# Patient Record
Sex: Female | Born: 1965 | Hispanic: No | Marital: Married | Smoking: Never smoker
Health system: Southern US, Community
[De-identification: ages and names within clinical notes are randomized; demographics above are authoritative.]

## PROBLEM LIST (undated history)

## (undated) DIAGNOSIS — N841 Polyp of cervix uteri: Secondary | ICD-10-CM

## (undated) DIAGNOSIS — D649 Anemia, unspecified: Secondary | ICD-10-CM

## (undated) HISTORY — PX: OTHER SURGICAL HISTORY: SHX169

## (undated) HISTORY — PX: TUBAL LIGATION: SHX77

---

## 1989-03-16 HISTORY — PX: BREAST SURGERY: SHX581

## 2009-03-16 HISTORY — PX: BACK SURGERY: SHX140

## 2011-06-04 ENCOUNTER — Encounter: Payer: Self-pay | Admitting: Obstetrics and Gynecology

## 2011-06-19 ENCOUNTER — Other Ambulatory Visit: Payer: Self-pay | Admitting: Obstetrics and Gynecology

## 2011-07-06 ENCOUNTER — Encounter (HOSPITAL_COMMUNITY): Payer: Self-pay | Admitting: *Deleted

## 2011-07-17 ENCOUNTER — Encounter (HOSPITAL_COMMUNITY): Payer: Self-pay

## 2011-07-30 ENCOUNTER — Encounter (HOSPITAL_COMMUNITY): Payer: Self-pay | Admitting: Anesthesiology

## 2011-07-30 ENCOUNTER — Ambulatory Visit (HOSPITAL_COMMUNITY)
Admission: RE | Admit: 2011-07-30 | Discharge: 2011-07-30 | Disposition: A | Discharge status: Home / Self Care | Payer: BC Managed Care – PPO | Source: Ambulatory Visit | Attending: Obstetrics and Gynecology | Admitting: Obstetrics and Gynecology

## 2011-07-30 ENCOUNTER — Ambulatory Visit (HOSPITAL_COMMUNITY): Payer: BC Managed Care – PPO | Admitting: Anesthesiology

## 2011-07-30 ENCOUNTER — Encounter (HOSPITAL_COMMUNITY): Payer: Self-pay | Admitting: *Deleted

## 2011-07-30 ENCOUNTER — Encounter (HOSPITAL_COMMUNITY)
Admission: RE | Disposition: A | Discharge status: Home / Self Care | Payer: Self-pay | Source: Ambulatory Visit | Attending: Obstetrics and Gynecology

## 2011-07-30 DIAGNOSIS — N949 Unspecified condition associated with female genital organs and menstrual cycle: Secondary | ICD-10-CM | POA: Insufficient documentation

## 2011-07-30 DIAGNOSIS — N938 Other specified abnormal uterine and vaginal bleeding: Secondary | ICD-10-CM | POA: Insufficient documentation

## 2011-07-30 HISTORY — DX: Polyp of cervix uteri: N84.1

## 2011-07-30 HISTORY — DX: Anemia, unspecified: D64.9

## 2011-07-30 LAB — DIFFERENTIAL
Eosinophils Absolute: 0.2 10*3/uL (ref 0.0–0.7)
Eosinophils Relative: 4 % (ref 0–5)
Lymphs Abs: 1.8 10*3/uL (ref 0.7–4.0)
Monocytes Absolute: 0.4 10*3/uL (ref 0.1–1.0)
Monocytes Relative: 9 % (ref 3–12)

## 2011-07-30 LAB — URINALYSIS, ROUTINE W REFLEX MICROSCOPIC
Bilirubin Urine: NEGATIVE
Nitrite: NEGATIVE
Protein, ur: NEGATIVE mg/dL
Specific Gravity, Urine: 1.02 (ref 1.005–1.030)
Urobilinogen, UA: 0.2 mg/dL (ref 0.0–1.0)

## 2011-07-30 LAB — URINE MICROSCOPIC-ADD ON

## 2011-07-30 LAB — COMPREHENSIVE METABOLIC PANEL
BUN: 7 mg/dL (ref 6–23)
Calcium: 9.1 mg/dL (ref 8.4–10.5)
Creatinine, Ser: 0.58 mg/dL (ref 0.50–1.10)
GFR calc Af Amer: >90 mL/min (ref >90)
GFR calc non Af Amer: >90 mL/min (ref >90)
Glucose, Bld: 96 mg/dL (ref 70–99)
Total Protein: 6.8 g/dL (ref 6.0–8.3)

## 2011-07-30 LAB — CBC
HCT: 39 % (ref 36.0–46.0)
Hemoglobin: 12.7 g/dL (ref 12.0–15.0)
MCH: 30.3 pg (ref 26.0–34.0)
MCV: 93.1 fL (ref 78.0–100.0)
Platelets: 231 10*3/uL (ref 150–400)
RBC: 4.19 MIL/uL (ref 3.87–5.11)

## 2011-07-30 LAB — PREGNANCY, URINE: Preg Test, Ur: NEGATIVE

## 2011-07-30 SURGERY — DILATATION & CURETTAGE/HYSTEROSCOPY WITH RESECTOCOPE
Anesthesia: Spinal | Site: Vagina | Wound class: Clean Contaminated

## 2011-07-30 MED ORDER — LIDOCAINE HCL (CARDIAC) 20 MG/ML IV SOLN
INTRAVENOUS | Status: AC
  Filled 2011-07-30: qty 5

## 2011-07-30 MED ORDER — FENTANYL CITRATE 0.05 MG/ML IJ SOLN
INTRAMUSCULAR | Status: AC
  Filled 2011-07-30: qty 2

## 2011-07-30 MED ORDER — LIDOCAINE IN DEXTROSE 5-7.5 % IV SOLN
INTRAVENOUS | Status: DC | PRN
  Administered 2011-07-30: 50 mg via INTRATHECAL

## 2011-07-30 MED ORDER — FENTANYL CITRATE 0.05 MG/ML IJ SOLN: 25.0000 ug | INTRAMUSCULAR | Status: DC | PRN

## 2011-07-30 MED ORDER — MEPERIDINE HCL 25 MG/ML IJ SOLN: 6.2500 mg | INTRAMUSCULAR | Status: DC | PRN

## 2011-07-30 MED ORDER — METOCLOPRAMIDE HCL 5 MG/ML IJ SOLN: 10.0000 mg | Freq: Once | INTRAMUSCULAR | Status: DC | PRN

## 2011-07-30 MED ORDER — KETOROLAC TROMETHAMINE 30 MG/ML IJ SOLN
INTRAMUSCULAR | Status: DC | PRN
  Administered 2011-07-30: 30 mg via INTRAVENOUS

## 2011-07-30 MED ORDER — LACTATED RINGERS IV SOLN
INTRAVENOUS | Status: DC
  Administered 2011-07-30: 08:00:00 via INTRAVENOUS
  Administered 2011-07-30: 125 mL/h via INTRAVENOUS

## 2011-07-30 MED ORDER — PROPOFOL 10 MG/ML IV EMUL
INTRAVENOUS | Status: AC
  Filled 2011-07-30: qty 20

## 2011-07-30 MED ORDER — MIDAZOLAM HCL 5 MG/5ML IJ SOLN
INTRAMUSCULAR | Status: DC | PRN
  Administered 2011-07-30: 2 mg via INTRAVENOUS

## 2011-07-30 MED ORDER — DEXAMETHASONE SODIUM PHOSPHATE 10 MG/ML IJ SOLN
INTRAMUSCULAR | Status: AC
  Filled 2011-07-30: qty 1

## 2011-07-30 MED ORDER — KETOROLAC TROMETHAMINE 30 MG/ML IJ SOLN
INTRAMUSCULAR | Status: AC
  Filled 2011-07-30: qty 1

## 2011-07-30 MED ORDER — MIDAZOLAM HCL 2 MG/2ML IJ SOLN
INTRAMUSCULAR | Status: AC
  Filled 2011-07-30: qty 2

## 2011-07-30 MED ORDER — ONDANSETRON HCL 4 MG/2ML IJ SOLN
INTRAMUSCULAR | Status: AC
  Filled 2011-07-30: qty 2

## 2011-07-30 MED ORDER — GLYCINE 1.5 % IR SOLN
Status: DC | PRN
  Administered 2011-07-30: 3000 mL

## 2011-07-30 SURGICAL SUPPLY — 20 items
CANISTER SUCTION 2500CC (MISCELLANEOUS) ×2 IMPLANT
CATH ROBINSON RED A/P 16FR (CATHETERS) ×2 IMPLANT
CLOTH BEACON ORANGE TIMEOUT ST (SAFETY) ×2 IMPLANT
CONTAINER PREFILL 10% NBF 60ML (FORM) ×4 IMPLANT
CORD ACTIVE DISPOSABLE (ELECTRODE)
CORD ELECTRO ACTIVE DISP (ELECTRODE) IMPLANT
ELECT LOOP GYNE PRO 24FR (CUTTING LOOP)
ELECT REM PT RETURN 9FT ADLT (ELECTROSURGICAL) ×2
ELECT VAPORTRODE GRVD BAR (ELECTRODE) IMPLANT
ELECTRODE LOOP GYNE PRO 24FR (CUTTING LOOP) IMPLANT
ELECTRODE REM PT RTRN 9FT ADLT (ELECTROSURGICAL) ×1 IMPLANT
GLOVE BIO SURGEON STRL SZ7.5 (GLOVE) ×4 IMPLANT
GOWN PREVENTION PLUS LG XLONG (DISPOSABLE) ×2 IMPLANT
GOWN PREVENTION PLUS XLARGE (GOWN DISPOSABLE) ×2 IMPLANT
GOWN STRL REIN XL XLG (GOWN DISPOSABLE) ×2 IMPLANT
LIDOCAINE 1% PLAIN IMPLANT
LOOP ANGLED CUTTING 22FR (CUTTING LOOP) IMPLANT
PACK HYSTEROSCOPY LF (CUSTOM PROCEDURE TRAY) ×2 IMPLANT
TOWEL OR 17X24 6PK STRL BLUE (TOWEL DISPOSABLE) ×4 IMPLANT
WATER STERILE IRR 1000ML POUR (IV SOLUTION) ×2 IMPLANT

## 2011-07-30 NOTE — Anesthesia Preprocedure Evaluation (Addendum)
Anesthesia Evaluation  Patient identified by MRN, date of birth, ID band Patient awake    Reviewed: Allergy & Precautions, H&P , NPO status , Patient's Chart, lab work & pertinent test results  Airway Mallampati: I TM Distance: >3 FB Neck ROM: Full    Dental No notable dental hx. (+) Teeth Intact   Pulmonary neg pulmonary ROS,  breath sounds clear to auscultation  Pulmonary exam normal       Cardiovascular Exercise Tolerance: Good negative cardio ROS  Rhythm:Regular Rate:Normal     Neuro/Psych negative neurological ROS  negative psych ROS   GI/Hepatic negative GI ROS, Neg liver ROS,   Endo/Other  negative endocrine ROS  Renal/GU negative Renal ROS  negative genitourinary   Musculoskeletal negative musculoskeletal ROS (+)   Abdominal   Peds  Hematology negative hematology ROS (+)   Anesthesia Other Findings   Reproductive/Obstetrics negative OB ROS Endometrial Polyp                          Anesthesia Physical Anesthesia Plan  ASA: I  Anesthesia Plan: Spinal   Post-op Pain Management:    Induction:   Airway Management Planned:   Additional Equipment:   Intra-op Plan:   Post-operative Plan:   Informed Consent: I have reviewed the patients History and Physical, chart, labs and discussed the procedure including the risks, benefits and alternatives for the proposed anesthesia with the patient or authorized representative who has indicated his/her understanding and acceptance.   Dental advisory given  Plan Discussed with: CRNA, Anesthesiologist and Surgeon  Anesthesia Plan Comments: (Patient requests not to go to sleep - wants spinal anesthesia)       Anesthesia Quick Evaluation

## 2011-07-30 NOTE — Anesthesia Procedure Notes (Signed)
Spinal  Patient location during procedure: OR Start time: 07/30/2011 7:39 AM Staffing Performed by: anesthesiologist  Preanesthetic Checklist Completed: patient identified, site marked, surgical consent, pre-op evaluation, timeout performed, IV checked, risks and benefits discussed and monitors and equipment checked Spinal Block Patient position: sitting Prep: site prepped and draped and DuraPrep Patient monitoring: heart rate, cardiac monitor, continuous pulse ox and blood pressure Approach: midline Location: L3-4 Injection technique: single-shot Needle Needle type: Sprotte  Needle gauge: 24 G Needle length: 9 cm Assessment Sensory level: T4 Additional Notes Clear free flow CSF on first attempt.  No paresthesia.  Patient tolerated procedure well.  Jasmine December, MD

## 2011-07-30 NOTE — H&P (Signed)
46 yo Asian female for D&C FOR AUB.pT HAD POLYPS ON SONOHYSTEROGRAM  aLLERGIES nkda Illnesses None Operrations BTL   Alcohol, tobacco and drugs None   Ob hx 3 vaginal deliveries  PE vs NORMAL hent NEGATIVE nECK SUPPLE NO THYROMEGALY bREASTS NEGATIVE hEART AND LUNGS NORMAL aBDOMEN SOFT AND NOT TENDER pELVIC: uTERUS ANTERIOR AND NORMAL SIZE tHE ADNEXA ARE CLEAR   iMPRESSION: eNDOMETRIAL POLYPS aub

## 2011-07-30 NOTE — Anesthesia Postprocedure Evaluation (Signed)
Anesthesia Post Note  Patient: Sarah Jennings  Procedure(s) Performed: Procedure(s) (LRB): DILATATION & CURETTAGE/HYSTEROSCOPY WITH RESECTOCOPE (N/A)  Anesthesia type: Spinal  Patient location: PACU  Post pain: Pain level controlled  Post assessment: Post-op Vital signs reviewed  Last Vitals:  Filed Vitals:   07/30/11 1000  BP: 103/63  Pulse: 56  Temp:   Resp: 22    Post vital signs: Reviewed  Level of consciousness: awake  Complications: No apparent anesthesia complications

## 2011-07-30 NOTE — Progress Notes (Signed)
Patient ID: Sarah Jennings, female   DOB: 1965/05/31, 46 y.o.   MRN: [id number] Pt reports no change in her health since her pre op exam

## 2011-07-30 NOTE — Discharge Instructions (Signed)
No vaginal entrance x 2 days, call with temp > 100.4 degrees, heavy bleeding or any unusual problems.DISCHARGE INSTRUCTIONS: D&C / D&E The following instructions have been prepared to help you care for yourself upon your return home.   Personal hygiene: Marland Kitchen Use sanitary pads for vaginal drainage, not tampons. . Shower the day after your procedure. . NO tub baths, pools or Jacuzzis for 2-3 weeks. . Wipe front to back after using the bathroom.  Activity and limitations: . Do NOT drive or operate any equipment for 24 hours. The effects of anesthesia are still present and drowsiness may result. . Do NOT rest in bed all day. . Walking is encouraged. . Walk up and down stairs slowly. . You may resume your normal activity in one to two days or as indicated by your physician.  Sexual activity: NO intercourse for at least 2 weeks after the procedure, or as indicated by your physician.  Diet: Eat a light meal as desired this evening. You may resume your usual diet tomorrow.  Return to work: You may resume your work activities in one to two days or as indicated by your doctor.  What to expect after your surgery: Expect to have vaginal bleeding/discharge for 2-3 days and spotting for up to 10 days. It is not unusual to have soreness for up to 1-2 weeks. You may have a slight burning sensation when you urinate for the first day. Mild cramps may continue for a couple of days. You may have a regular period in 2-6 weeks.  Call your doctor for any of the following: . Excessive vaginal bleeding, saturating and changing one pad every hour. . Inability to urinate 6 hours after discharge from hospital. . Pain not relieved by pain medication. . Fever of 100.4 F or greater. . Unusual vaginal discharge or odor.  Return to office ________________ Call for an appointment ___________________  Patient's signature: ______________________  Nurse's signature ________________________  Post Anesthesia Care Unit  [phone]

## 2011-07-30 NOTE — Transfer of Care (Signed)
Immediate Anesthesia Transfer of Care Note  Patient: Sarah Jennings  Procedure(s) Performed: Procedure(s) (LRB): DILATATION & CURETTAGE/HYSTEROSCOPY WITH RESECTOCOPE (N/A)  Patient Location: PACU  Anesthesia Type: spinal  Level of Consciousness: sedated  Airway & Oxygen Therapy: Patient Spontanous Breathing and Patient connected to nasal cannula oxygen  Post-op Assessment: Report given to PACU RN and Post -op Vital signs reviewed and stable  Post vital signs: stable  Complications: No apparent anesthesia complications

## 2011-07-30 NOTE — H&P (Signed)
NAME:  Sarah Jennings, [place] NO.:  [id number]  MEDICAL RECORD NO.:  [id number]  LOCATION:                                 FACILITY:  PHYSICIAN:  Malachi Pro. Ambrose Mantle, M.D. DATE OF BIRTH:  April 04, 1965  DATE OF ADMISSION:  07/30/2011 DATE OF DISCHARGE:                             HISTORY & PHYSICAL   PRESENT ILLNESS:  This is a 46 year old Asian female para 3-0-0-3, who is admitted for D and C, hysteroscopy because of history of abnormal bleeding and endometrial polyps found on sonohysterogram.  Last period of June 29, 2011 normal in flow for 5 days.  I saw this patient on June 11, 2011.  At that time, she gave a history as follows.  She has had normal periods until the spring of 2012, when she missed her period in May 2012 and June 2012 and had a 10-day period in July 2012.  An ultrasound was done, with unknown results, but she was referred to an OB/GYN doctor.  An ultrasound was normal, but a sonohysterogram showed polyps and she was advised to have them removed.  Her periods are regular and last 4-5 days followed by 2-3 days of spotting.  The physician had advised her to have D and C and hysteroscopy and she wanted my opinion.  I reviewed the sonohysterogram, saw the 2 polyps that were documented and advised her that since she had a history of abnormal bleeding that she could have an endometrial biopsy to confirm benign endometrium and if it was benign then she could be followed for abnormal bleeding.  She could also have D and C and hysteroscopy since the sonohysterogram had documented polyps.  She chose to proceed with D and C, hysteroscopy.  ALLERGIES:  No known drug allergies.  PAST SURGICAL HISTORY:  Bilateral tubal ligation.  No significant medical history.  FAMILY HISTORY:  Mother has high blood pressure.  OB HISTORY:  Three vaginal deliveries without significant complications.  PHYSICAL EXAMINATION:  GENERAL:  Well developed, very slender Asian female, in  no distress. VITAL SIGNS:  At the time of her physical exam, she was 5 feet 2. Weighed 97 pounds.  Pulse was 62, blood pressure 98/64. HEAD, EYES, NOSE AND THROAT:  Normal. NECK:  Supple without thyromegaly. BREASTS:  Normal without masses, sitting or lying down. ABDOMEN:  Soft and nontender.  No masses were present.  Liver, spleen and kidneys were not felt. GU:  External genitalia appeared normal.  Vagina was clean.  The cervix was healthy.  No lesions were present.  Uterus was nontender.  Appeared to be midline in position, normal size.  Adnexal were free of masses. The patient stated that she had a normal Pap smear 2 months prior to my seeing her.  She had a mammogram 2 weeks prior that is showing a cyst. She denied smoking, denied illicit drugs and was a minimal drinker.  ADMITTING IMPRESSION:  History of abnormal uterine bleeding with endometrial polyp seen on sonohysterogram.  The patient chooses to proceed with D and C, hysteroscopy.  The risks of the procedure were discussed with her including infection, hemorrhage, deep venous thrombosis, injury to bladder, bowel, or ureter; perforation of the uterus,  possible blood transfusion, wound complications and fluid overload.  She understands and agrees to proceed.     Malachi Pro. Ambrose Mantle, M.D.     TFH/MEDQ  D:  07/29/2011  T:  07/29/2011  Job:  [id number]

## 2011-07-30 NOTE — Op Note (Signed)
Operative note on Sarah Jennings:  Date of the operation: 07/30/2011  Preoperative diagnosis: History of abnormal uterine bleeding, endometrial polyps by sonohysterogram  Postoperative diagnosis: No polyps found   Operation: D&C hysteroscopy  Operator: Ambrose Mantle  Anesthesia: Spinal Dr. Rodman Pickle  The patient was brought to the operating room and given a spinal anesthetic by Dr. Rodman Pickle. She was placed in the [place] stirrups in lithotomy position. The vulva vagina perineum and urethra were prepped with Betadine solution and the bladder was emptied with a [place] catheter. A timeout was done. Exam revealed the uterus to be anterior normal size the adnexa were free of masses. The area was draped as a sterile field. The cervix was grasped with a tenaculum and the uterus was sounded to 8 cm. The cervical canal was dilated and a hysteroscope was introduced. The entire endometrial cavity was visualized and there were no polyps. There was redundant endometrial tissue on the posterior uterine wall that could have masqueraded  as polyps on the sonohysterogram. A fractional D&C was done, producing a small amount of tissue from both the endocervix and the endometrial cavity. Polyp forceps were used to try to remove all the tissue from both sites. I looked again with the hysteroscope, introduced a grasper and removed small portions of endometrial tissue. The hysteroscope was removed, there was some bleeding from the tenaculum site and it was sutured with 2-0 chromic catgut. Hemostasis was adequate and the procedure was terminated. I was told it was a definite of 60 cc but the collection tubing had never been hooked up to the receptacle so the 60 cc was on the floor. Blood loss was less than 50 cc.. The patient was returned to recovery in satisfactory condition

## 2015-02-28 ENCOUNTER — Ambulatory Visit (INDEPENDENT_AMBULATORY_CARE_PROVIDER_SITE_OTHER): Payer: 59 | Admitting: Osteopathic Medicine

## 2015-02-28 ENCOUNTER — Encounter: Payer: Self-pay | Admitting: Osteopathic Medicine

## 2015-02-28 VITALS — BP 98/67 | HR 62 | Ht 63.0 in | Wt 95.0 lb

## 2015-02-28 DIAGNOSIS — Z23 Encounter for immunization: Secondary | ICD-10-CM | POA: Diagnosis not present

## 2015-02-28 DIAGNOSIS — Z1211 Encounter for screening for malignant neoplasm of colon: Secondary | ICD-10-CM | POA: Insufficient documentation

## 2015-02-28 DIAGNOSIS — Z862 Personal history of diseases of the blood and blood-forming organs and certain disorders involving the immune mechanism: Secondary | ICD-10-CM | POA: Insufficient documentation

## 2015-02-28 DIAGNOSIS — Z79899 Other long term (current) drug therapy: Secondary | ICD-10-CM | POA: Diagnosis not present

## 2015-02-28 DIAGNOSIS — Z139 Encounter for screening, unspecified: Secondary | ICD-10-CM | POA: Diagnosis not present

## 2015-02-28 DIAGNOSIS — Z0000 Encounter for general adult medical examination without abnormal findings: Secondary | ICD-10-CM | POA: Diagnosis not present

## 2015-02-28 DIAGNOSIS — E559 Vitamin D deficiency, unspecified: Secondary | ICD-10-CM

## 2015-02-28 LAB — COMPLETE METABOLIC PANEL WITH GFR
ALBUMIN: 4.7 g/dL (ref 3.6–5.1)
ALT: 24 U/L (ref 6–29)
AST: 28 U/L (ref 10–35)
Alkaline Phosphatase: 42 U/L (ref 33–130)
BILIRUBIN TOTAL: 0.8 mg/dL (ref 0.2–1.2)
BUN: 10 mg/dL (ref 7–25)
CALCIUM: 9.6 mg/dL (ref 8.6–10.4)
CHLORIDE: 102 mmol/L (ref 98–110)
CO2: 27 mmol/L (ref 20–31)
CREATININE: 0.48 mg/dL — AB (ref 0.50–1.05)
GFR, Est African American: >89 mL/min (ref >=60)
GFR, Est Non African American: >89 mL/min (ref >=60)
Glucose, Bld: 80 mg/dL (ref 65–99)
Potassium: 4.5 mmol/L (ref 3.5–5.3)
Sodium: 139 mmol/L (ref 135–146)
TOTAL PROTEIN: 7.4 g/dL (ref 6.1–8.1)

## 2015-02-28 LAB — LIPID PANEL
CHOL/HDL RATIO: 2 ratio (ref <=5.0)
Cholesterol: 184 mg/dL (ref 125–200)
HDL: 91 mg/dL (ref >=46)
LDL Cholesterol: 81 mg/dL (ref <130)
Triglycerides: 60 mg/dL (ref <150)
VLDL: 12 mg/dL (ref <30)

## 2015-02-28 LAB — CBC WITH DIFFERENTIAL/PLATELET
BASOS ABS: 0 10*3/uL (ref 0.0–0.1)
Basophils Relative: 1 % (ref 0–1)
Eosinophils Absolute: 0.1 10*3/uL (ref 0.0–0.7)
Eosinophils Relative: 2 % (ref 0–5)
HEMATOCRIT: 43.5 % (ref 36.0–46.0)
HEMOGLOBIN: 14.7 g/dL (ref 12.0–15.0)
LYMPHS ABS: 1.5 10*3/uL (ref 0.7–4.0)
LYMPHS PCT: 36 % (ref 12–46)
MCH: 31 pg (ref 26.0–34.0)
MCHC: 33.8 g/dL (ref 30.0–36.0)
MCV: 91.8 fL (ref 78.0–100.0)
MPV: 9.2 fL (ref 8.6–12.4)
Monocytes Absolute: 0.3 10*3/uL (ref 0.1–1.0)
Monocytes Relative: 7 % (ref 3–12)
NEUTROS ABS: 2.2 10*3/uL (ref 1.7–7.7)
NEUTROS PCT: 54 % (ref 43–77)
Platelets: 255 10*3/uL (ref 150–400)
RBC: 4.74 MIL/uL (ref 3.87–5.11)
RDW: 12.4 % (ref 11.5–15.5)
WBC: 4.1 10*3/uL (ref 4.0–10.5)

## 2015-02-28 LAB — TSH: TSH: 1.104 u[IU]/mL (ref 0.350–4.500)

## 2015-02-28 MED ORDER — AMBULATORY NON FORMULARY MEDICATION: Status: DC

## 2015-02-28 NOTE — Progress Notes (Signed)
HPI: Sarah Jennings is a 49 y.o. female who presents to [facility] today for chief complaint of:  Chief Complaint  Patient presents with  . Establish Care    Preventive care reviewed as below  Cyst on wrist: . Location: dorsal R wrist . Quality: "bubble" . Severity: mild, nonpainful . Duration: several months  No other complaints or concerns  Past medical, social and family history reviewed: Past Medical History  Diagnosis Date  . Anemia     occas takes iron pills  . Polyp cervix    Past Surgical History  Procedure Laterality Date  . Back surgery  2011    local to remove a cyst  . Svd      x 3  . Tubal ligation    . Breast surgery  1991    cyst removed from left breast - local   Social History  Substance Use Topics  . Smoking status: Not on file  . Smokeless tobacco: Never Used  . Alcohol Use: Yes     Comment: socially   History reviewed. No pertinent family history.  Current Outpatient Prescriptions  Medication Sig Dispense Refill  . ibuprofen (ADVIL,MOTRIN) 200 MG tablet Take 200 mg by mouth daily as needed. Headache or PMS    . Multiple Vitamin (MULITIVITAMIN WITH MINERALS) TABS Take 1 tablet by mouth daily.    Marland Kitchen OVER THE COUNTER MEDICATION Take 1 tablet by mouth daily. "Hair and Nail vitamin"     No current facility-administered medications for this visit.   No Known Allergies    Review of Systems: CONSTITUTIONAL:  No  fever, no chills, No  unintentional weight changes HEAD/EYES/EARS/NOSE/THROAT: No  headache, no vision change, no hearing change, No  sore throat, No  sinus pressure CARDIAC: No  chest pain, No  pressure, No palpitations, No  orthopnea RESPIRATORY: No  cough, No  shortness of breath/wheeze GASTROINTESTINAL: No  nausea, No  vomiting, No  abdominal pain, No  blood in stool, No  diarrhea, No  constipation  MUSCULOSKELETAL: No  myalgia/arthralgia GENITOURINARY: No  incontinence, No  abnormal genital  bleeding/discharge SKIN: No  rash/wounds/concerning lesions HEM/ONC: No  easy bruising/bleeding, No  abnormal lymph node ENDOCRINE: No  polyuria/polydipsia/polyphagia, No  heat/cold intolerance  NEUROLOGIC: No  weakness, No  dizziness, No  slurred speech PSYCHIATRIC: No  concerns with depression, No  concerns with anxiety, No sleep problems     Exam:  BP 98/67 mmHg  Pulse 62  Ht 5\' 3"  (1.6 m)  Wt 95 lb (43.092 kg)  BMI 16.83 kg/m2 Constitutional: VS see above. General Appearance: alert, well-developed, well-nourished, NAD Eyes: Normal lids and conjunctive, non-icteric sclera, PERRLA Ears, Nose, Mouth, Throat: MMM, Normal external inspection ears/nares/mouth/lips/gums, TM normal, posterior pharynx No  erythema No  exudate Neck: No masses, trachea midline. No thyroid enlargement/tenderness/mass appreciated. No lymphadenopathy Respiratory: Normal respiratory effort. no wheeze, no rhonchi, no rales Cardiovascular: S1/S2 normal, no murmur, no rub/gallop auscultated. RRR.  No carotid bruit or JVD. No abdominal aortic bruit.  Pedal pulse II/IV bilaterally DP and PT.  No lower extremity edema. Gastrointestinal: Nontender, no masses. No hepatomegaly, no splenomegaly. No hernia appreciated. Bowel sounds normal. Rectal exam deferred.  Musculoskeletal: Gait normal. No clubbing/cyanosis of digits.  Neurological: No cranial nerve deficit on limited exam. Motor and sensation intact and symmetric Skin: warm, dry, intact. No rash/ulcer. No concerning nevi or subq nodules on limited exam.   Psychiatric: Normal judgment/insight. Normal mood and affect. Oriented x3.  No results found for this or any previous visit (from the past 72 hour(s)).    ASSESSMENT/PLAN: Labs and screening as below, pt f/u w/ OBGYN for Pap and Mammo, UTD on vaccines per patient, Cologuard today  Annual physical exam - Plan: CBC with Differential/Platelet, COMPLETE METABOLIC PANEL WITH GFR, Hepatitis C antibody, reflex, HIV  antibody, Lipid panel, TSH, VITAMIN D 25 Hydroxy (Vit-D Deficiency, Fractures)  Screening - Plan: Hepatitis C antibody, reflex, HIV antibody, Lipid panel  Need for zoster vaccination - Plan: AMBULATORY NON FORMULARY MEDICATION  Medication management - Plan: CBC with Differential/Platelet, COMPLETE METABOLIC PANEL WITH GFR     FEMALE PREVENTIVE CARE  ANNUAL SCREENING/COUNSELING Tobacco - Never  Alcohol - social drinker Diet/Exercise - HEALTHY HABITS DISCUSSED TO DECREASE CV RISK Sexual Health - Yes with female. STI - The patient denies history of sexually transmitted disease. INTERESTED IN STI TESTING - no Depression - PQH2 Negative Domestic violence concerns - no HTN SCREENING - SEE VITALS Vaccination status - SEE BELOW  INFECTIOUS DISEASE SCREENING HIV - all adults 15-65 - needs GC/CT - sexually active - does not need HepC - born 66-1966 - needs TB - if risk/required by employer - does not need  DISEASE SCREENING Lipid - (Low risk screen M35/F45; High risk screen M25/F35 if HTN, Tob, FH CHD M<55/F<65) - does not need DM2 (45+ or Risk = FH 1st deg DM, Hx GDM, overweight/sedentary, high-risk ethnicity, HTN) - does not need Osteoporosis - age 22+ or one sooner if risk - does not need  CANCER SCREENING Cervical - Pap q3 yr age 44+, Pap + HPV q5y age 83+ - PAP - does not need Breast - Mammo age 62+ (C) and biennial age 45-75 (A) - 57 - does not need, reports normal mammo 04/2014 Lung - annual low dose CT Chest age 23-75 w/ 30+ PY, current/quit past 15 years - CT - does not need Colon - age 78+ or 50 years of age prior to [place] Dx - GI REFERRAL - needs, will get Cologuard  ADULT VACCINATION Influenza - annual - already has  Td booster every 10 years - already has, per patient HPV - age <87yo - was not indicated Zoster - age 69+ - was given prescription  Pneumonia - age 62+ sooner if risk (DM, smoker, other) - was not indicated  OTHER Fall - exercise and Vit D age 52+ - does  not need Consider ASA - age 23-59 - does not need   Return in about 1 year (around 02/28/2016), or sooner if needed, for ANNUAL PHYSICAL.

## 2015-02-28 NOTE — Patient Instructions (Signed)
Ganglion Cyst  A ganglion cyst is a noncancerous, fluid-filled lump that occurs near joints or tendons. The ganglion cyst grows out of a joint or the lining of a tendon. It most often develops in the hand or wrist, but it can also develop in the shoulder, elbow, hip, knee, ankle, or foot. The round or oval ganglion cyst can be the size of a pea or larger than a grape. Increased activity may enlarge the size of the cyst because more fluid starts to build up.   CAUSES  It is not known what causes a ganglion cyst to grow. However, it may be related to:  · Inflammation or irritation around the joint.  · An injury.  · Repetitive movements or overuse.  · Arthritis.  RISK FACTORS  Risk factors include:  · Being a woman.  · Being age 20-50.  SIGNS AND SYMPTOMS  Symptoms may include:   · A lump. This most often appears on the hand or wrist, but it can occur in other areas of the body.  · Tingling.  · Pain.  · Numbness.  · Muscle weakness.  · Weak grip.  · Less movement in a joint.  DIAGNOSIS  Ganglion cysts are most often diagnosed based on a physical exam. Your health care provider will feel the lump and may shine a light alongside it. If it is a ganglion cyst, a light often shines through it. Your health care provider may order an X-ray, ultrasound, or MRI to rule out other conditions.  TREATMENT  Ganglion cysts usually go away on their own without treatment. If pain or other symptoms are involved, treatment may be needed. Treatment is also needed if the ganglion cyst limits your movement or if it gets infected. Treatment may include:  · Wearing a brace or splint on your wrist or finger.  · Taking anti-inflammatory medicine.  · Draining fluid from the lump with a needle (aspiration).  · Injecting a steroid into the joint.  · Surgery to remove the ganglion cyst.  HOME CARE INSTRUCTIONS  · Do not press on the ganglion cyst, poke it with a needle, or hit it.  · Take medicines only as directed by your health care  provider.  · Wear your brace or splint as directed by your health care provider.  · Watch your ganglion cyst for any changes.  · Keep all follow-up visits as directed by your health care provider. This is important.  SEEK MEDICAL CARE IF:  · Your ganglion cyst becomes larger or more painful.  · You have increased redness, red streaks, or swelling.  · You have pus coming from the lump.  · You have weakness or numbness in the affected area.  · You have a fever or chills.     This information is not intended to replace advice given to you by your health care provider. Make sure you discuss any questions you have with your health care provider.     Document Released: 02/28/2000 Document Revised: 03/23/2014 Document Reviewed: 08/15/2013  Elsevier Interactive Patient Education ©2016 [employer]

## 2015-03-01 LAB — HIV ANTIBODY (ROUTINE TESTING W REFLEX): HIV: NONREACTIVE

## 2015-03-01 LAB — VITAMIN D 25 HYDROXY (VIT D DEFICIENCY, FRACTURES): VIT D 25 HYDROXY: 21 ng/mL — AB (ref 30–100)

## 2015-03-01 LAB — HEPATITIS C ANTIBODY: HCV AB: NEGATIVE

## 2015-03-01 MED ORDER — VITAMIN D (ERGOCALCIFEROL) 1.25 MG (50000 UNIT) PO CAPS: 50000.0000 [IU] | ORAL_CAPSULE | ORAL | Status: AC

## 2015-03-01 NOTE — Addendum Note (Signed)
Addended by: Maryla Morrow on: 03/01/2015 11:17 AM   Modules accepted: Orders

## 2015-03-24 ENCOUNTER — Other Ambulatory Visit: Payer: Self-pay | Admitting: Osteopathic Medicine

## 2015-03-27 LAB — COLOGUARD: Cologuard: NEGATIVE

## 2015-04-08 ENCOUNTER — Telehealth: Payer: Self-pay

## 2015-04-08 NOTE — Telephone Encounter (Signed)
Dr. Sheppard Coil patient cologuard came back negative. Results will be in your basket to sign and then go to Scanning. Please advise. Rhonda Cunningham,CMA

## 2015-04-09 NOTE — Telephone Encounter (Signed)
Sarah Jennings, Thanks, can you please let patient know about this reassuring result.  She'll want to think about rechecking this in three years.

## 2015-04-09 NOTE — Telephone Encounter (Signed)
Spoke to patient gave her results as noted below. Rhonda Cunningham,CMA  

## 2015-04-11 ENCOUNTER — Encounter: Payer: Self-pay | Admitting: Osteopathic Medicine

## 2015-04-13 ENCOUNTER — Encounter: Payer: Self-pay | Admitting: Emergency Medicine

## 2015-04-13 ENCOUNTER — Emergency Department
Admission: EM | Admit: 2015-04-13 | Discharge: 2015-04-13 | Disposition: A | Discharge status: Home / Self Care | Payer: 59 | Source: Home / Self Care | Attending: Family Medicine | Admitting: Family Medicine

## 2015-04-13 DIAGNOSIS — M25512 Pain in left shoulder: Secondary | ICD-10-CM

## 2015-04-13 DIAGNOSIS — M25522 Pain in left elbow: Secondary | ICD-10-CM | POA: Diagnosis not present

## 2015-04-13 DIAGNOSIS — M7522 Bicipital tendinitis, left shoulder: Secondary | ICD-10-CM | POA: Diagnosis not present

## 2015-04-13 DIAGNOSIS — M7712 Lateral epicondylitis, left elbow: Secondary | ICD-10-CM

## 2015-04-13 MED ORDER — MELOXICAM 7.5 MG PO TABS: 7.5000 mg | ORAL_TABLET | Freq: Every day | ORAL | Status: DC

## 2015-04-13 NOTE — Discharge Instructions (Signed)
°

## 2015-04-13 NOTE — ED Notes (Signed)
Patient presents to [facility] with C/O pain in the left shoulder and elbow for more than 2 months. Patient denies injury, works as an Therapist, sports. Rates pain 4/10 at this time and is taking Ibuprofen intermittently for pain control.

## 2015-04-13 NOTE — ED Provider Notes (Signed)
CSN: [id number]     Arrival date & time 04/13/15  1257 History   First MD Initiated Contact with Patient 04/13/15 1319     Chief Complaint  Patient presents with  . Shoulder Pain   (Consider location/radiation/quality/duration/timing/severity/associated sxs/prior Treatment) HPI  Pt is a 50yo female presenting to [facility] with c/o Left shoulder and elbow pain intermittently for about 2 months. Pt is Left hand dominant, works as a Marine scientist, and does do heavy lift at times for work.  She has been taking ibuprofen but only minimal help.  Pain is aching and sore, 4/10 at this time. Worse with certain movements and heavy lifting.  Denies prior injury or surgery to her Left shoulder or elbow. Denies numbness or tingling to Left arm. Denies neck or back pain.   Past Medical History  Diagnosis Date  . Anemia     occas takes iron pills  . Polyp cervix    Past Surgical History  Procedure Laterality Date  . Back surgery  2011    local to remove a cyst  . Svd      x 3  . Tubal ligation    . Breast surgery  1991    cyst removed from left breast - local   History reviewed. No pertinent family history. Social History  Substance Use Topics  . Smoking status: Never Smoker   . Smokeless tobacco: Never Used  . Alcohol Use: 0.0 oz/week    0 Standard drinks or equivalent per week     Comment: socially   OB History    No data available     Review of Systems  Constitutional: Negative for fever and chills.  Cardiovascular: Negative for chest pain and palpitations.  Musculoskeletal: Positive for myalgias and arthralgias. Negative for joint swelling.       Left shoulder and elbow  Skin: Negative for color change, rash and wound.  Neurological: Positive for weakness (Left arm due to pain). Negative for numbness.    Allergies  Review of patient's allergies indicates no known allergies.  Home Medications   Prior to Admission medications   Medication Sig Start Date End Date Taking? Authorizing  Provider  AMBULATORY NON FORMULARY MEDICATION Medication Name: Zostavax IM x 1 vial 02/28/15   Emeterio Reeve, DO  ibuprofen (ADVIL,MOTRIN) 200 MG tablet Take 200 mg by mouth daily as needed. Headache or PMS    Historical Provider, MD  meloxicam (MOBIC) 7.5 MG tablet Take 1 tablet (7.5 mg total) by mouth daily. 04/13/15   Noland Fordyce, PA-C  Multiple Vitamin (MULITIVITAMIN WITH MINERALS) TABS Take 1 tablet by mouth daily.    Historical Provider, MD  OVER THE COUNTER MEDICATION Take 1 tablet by mouth daily. "Hair and Nail vitamin"    Historical Provider, MD  Vitamin D, Ergocalciferol, (DRISDOL) 50000 UNITS CAPS capsule Take 1 capsule (50,000 Units total) by mouth every 7 (seven) days. Take for 8 total doses(weeks) 03/01/15   Emeterio Reeve, DO   Meds Ordered and Administered this Visit  Medications - No data to display  BP 117/78 mmHg  Pulse 83  Temp(Src) 97.6 F (36.4 C) (Oral)  Resp 16  Ht 5\' 2"  (1.575 m)  Wt 98 lb 8 oz (44.679 kg)  BMI 18.01 kg/m2  SpO2 100%  LMP 06/28/2011 No data found.   Physical Exam  Constitutional: She is oriented to person, place, and time. She appears well-developed and well-nourished.  HENT:  Head: Normocephalic and atraumatic.  Eyes: EOM are normal.  Neck: Normal range  of motion.  Cardiovascular: Normal rate.   Pulses:      Radial pulses are 2+ on the left side.  Pulmonary/Chest: Effort normal.  Musculoskeletal: Normal range of motion. She exhibits tenderness. She exhibits no edema.  Left shoulder: no deformity, full ROM. Tenderness to anterior aspect over biceps groove. Left elbow: full ROM, no deformity, tenderness to lateral epicondyl and surrounding muscles in forearm.  4/5 strength in Left arm compared to Right.  Neurological: She is alert and oriented to person, place, and time.  Left arm: normal sensation  Skin: Skin is warm and dry. No rash noted. No erythema.  Psychiatric: She has a normal mood and affect. Her behavior is normal.   Nursing note and vitals reviewed.   ED Course  Procedures (including critical care time)  Labs Review Labs Reviewed - No data to display  Imaging Review No results found.    MDM   1. Left shoulder pain   2. Biceps tendonitis on left   3. Left elbow pain   4. Lateral epicondylitis, left    Pt c/o Left shoulder and elbow pain intermittently for 2 months. Left hand dominant.  No hx of direct trauma to shoulder. No imaging indicated at this time.  Exam c/w tendonitis. Will tx conservatively.  Rx: meloxicam.  Tennis elbow strap also applied. Pt declined steroids, muscle relaxers or tramadol   Discussed alternating cool and warm packs. Home care exercises provided. F/u with PCP in 1-2 weeks if not improving, sooner if worsening. Patient verbalized understanding and agreement with treatment plan.     Noland Fordyce, PA-C 04/13/15 1422

## 2015-07-08 ENCOUNTER — Encounter: Payer: Self-pay | Admitting: Osteopathic Medicine

## 2015-07-08 ENCOUNTER — Ambulatory Visit (INDEPENDENT_AMBULATORY_CARE_PROVIDER_SITE_OTHER): Payer: 59 | Admitting: Osteopathic Medicine

## 2015-07-08 ENCOUNTER — Ambulatory Visit (INDEPENDENT_AMBULATORY_CARE_PROVIDER_SITE_OTHER): Payer: 59

## 2015-07-08 VITALS — BP 122/77 | HR 80 | Ht 63.0 in | Wt 99.0 lb

## 2015-07-08 DIAGNOSIS — M25512 Pain in left shoulder: Secondary | ICD-10-CM

## 2015-07-08 DIAGNOSIS — M7582 Other shoulder lesions, left shoulder: Secondary | ICD-10-CM

## 2015-07-08 MED ORDER — MELOXICAM 7.5 MG PO TABS: 7.5000 mg | ORAL_TABLET | Freq: Every day | ORAL | Status: DC

## 2015-07-08 NOTE — Progress Notes (Signed)
HPI: Sarah Jennings is a 50 y.o. female who presents to [place] today for chief complaint of:  Chief Complaint  Patient presents with  . Shoulder Pain    left shoulder and radiates down arm.     . Location: L shoulder joint and L thoracic/scapular pain . Quality: soreness, denies numbness/tingling in arm/fingers . Duration: several months . Timing: worse with any type of movement, worse with sleeping . Context: seen in UC 04/13/15 dx L shoulder pain, L biceps tendonitis, L elbow pain, L lateral epicondylitis  . Modifying factors: UC treated with meloxicam and tennis elbow strap which she didn't use much, no imaging. At that time, pt declined steroids, muscle relaxants, tramadol. Home care exercises also provided, pt says she did the "hand squeezing" exercises but that's all. Meloxicam worked a bit but now feeling worse. Worse wit movement and lying down, not hurting now. Tylenol started yesterday but still hurting. No injury.  . Assoc signs/symptoms: no numbness in hands/fingers   Past medical, social and family history reviewed: Past Medical History  Diagnosis Date  . Anemia     occas takes iron pills  . Polyp cervix    Past Surgical History  Procedure Laterality Date  . Back surgery  2011    local to remove a cyst  . Svd      x 3  . Tubal ligation    . Breast surgery  1991    cyst removed from left breast - local   Social History  Substance Use Topics  . Smoking status: Never Smoker   . Smokeless tobacco: Never Used  . Alcohol Use: 0.0 oz/week    0 Standard drinks or equivalent per week     Comment: socially   No family history on file.  Current Outpatient Prescriptions  Medication Sig Dispense Refill  . AMBULATORY NON FORMULARY MEDICATION Medication Name: Zostavax IM x 1 vial 1 Units 0  . ibuprofen (ADVIL,MOTRIN) 200 MG tablet Take 200 mg by mouth daily as needed. Headache or PMS    . meloxicam (MOBIC) 7.5 MG tablet Take 1  tablet (7.5 mg total) by mouth daily. 30 tablet 0  . Multiple Vitamin (MULITIVITAMIN WITH MINERALS) TABS Take 1 tablet by mouth daily.    Marland Kitchen OVER THE COUNTER MEDICATION Take 1 tablet by mouth daily. "Hair and Nail vitamin"    . Vitamin D, Ergocalciferol, (DRISDOL) 50000 UNITS CAPS capsule Take 1 capsule (50,000 Units total) by mouth every 7 (seven) days. Take for 8 total doses(weeks) 8 capsule 0   No current facility-administered medications for this visit.   No Known Allergies    Review of Systems: CONSTITUTIONAL:  No  fever, no chills,  CARDIAC: No  chest pain RESPIRATORY: No  cough, No  shortness of breath/wheeze MUSCULOSKELETAL: (+) myalgia/arthralgia as per HPI NEUROLOGIC: No  weakness, No  dizziness, No  slurred speech   Exam:  BP 122/77 mmHg  Pulse 80  Ht 5\' 3"  (1.6 m)  Wt 99 lb (44.906 kg)  BMI 17.54 kg/m2  LMP 06/28/2011 Constitutional: VS see above. General Appearance: alert, well-developed, well-nourished, NAD Eyes: Normal lids and conjunctive, non-icteric sclera Ears, Nose, Mouth, Throat: MMM,  Neck: No masses, trachea midline.  Respiratory: Normal respiratory effort. no wheeze, no rhonchi, no rales Cardiovascular: S1/S2 normal, no murmur, no rub/gallop auscultated. RRR. Radial pulse 2/4 on L. No lower extremity edema. Musculoskeletal: Gait normal. No clubbing/cyanosis of digits. Shoulder: guarding throughout, limits exam  Empty Can (Supraspinatus): negative  Drop Arm (Supraspinatus): negative  Liftoff (Subscapularis): positive  Speeds (Biceps Tendonitis/Labrum): positive  Hawkins (Impingement/Subacromial Bursitis): positive  Apprehension (Anterior Instability): positive   Neurological: No cranial nerve deficit on limited exam. Motor and sensation intact and symmetric Skin: warm, dry, intact. No rash/ulcer. No concerning nevi or subq nodules on limited exam.   Psychiatric: Normal judgment/insight. Normal mood and affect. Oriented x3.   Dg Shoulder  Left  07/08/2015  CLINICAL DATA:  Left shoulder pain for 3-4 months without trauma. EXAM: LEFT SHOULDER - 2+ VIEW COMPARISON:  None. FINDINGS: Visualized portion of the left hemithorax is normal. No acute fracture or dislocation. Joint spaces maintained. No focal osseous lesion. IMPRESSION: No acute osseous abnormality. Electronically Signed   By: Abigail Miyamoto M.D.   On: 07/08/2015 13:33    ASSESSMENT/PLAN: Vague pain complaints and almost all shoulder movements are painful, difficult to elicit etiology though certainly rotator cuff tendonitis, adhesive capsulitis or labral problem are in the differential. She declines steroids or joint injection at this time, will get XR, trial NSAID, refer to PT and likely will require MRI  Rotator cuff tendinitis, left - Plan: Ambulatory referral to Physical Therapy, meloxicam (MOBIC) 7.5 MG tablet, DG Shoulder Left  Left shoulder pain - Plan: Ambulatory referral to Physical Therapy, meloxicam (MOBIC) 7.5 MG tablet, DG Shoulder Left    Return in about 3 weeks (around 07/29/2015), or sooner if symptoms worsen or fail to improve, for [facility] FOLLOW-UP .

## 2015-07-08 NOTE — Patient Instructions (Signed)
It is difficult to say what is causing your pain based on just today's exam. The possibilities include tear or inflammation in the tendons of your rotator cuff muscles, but you also have signs concerning for labrum (cartilage) problems. Let's get an Xray and send you for physical therapy and treat with Meloxicam. I expect this will help somewhat but you may need an MRI eventually to get the best picture of the inside of the shoulder anatomy. We can also try a joint injection with one of our sports medicine specialists, Dr. Georgina Snell or Dr. Dianah Field. Please let us know if your pain is getting worse or changing, otherwise we will call you with the Xray results and plan to see you back here in 2 - 3 weeks.

## 2015-07-31 ENCOUNTER — Ambulatory Visit (INDEPENDENT_AMBULATORY_CARE_PROVIDER_SITE_OTHER): Payer: 59 | Admitting: Osteopathic Medicine

## 2015-07-31 ENCOUNTER — Encounter: Payer: Self-pay | Admitting: Osteopathic Medicine

## 2015-07-31 VITALS — BP 131/82 | HR 61 | Ht 62.0 in | Wt 99.0 lb

## 2015-07-31 DIAGNOSIS — M7582 Other shoulder lesions, left shoulder: Secondary | ICD-10-CM | POA: Diagnosis not present

## 2015-07-31 DIAGNOSIS — M25512 Pain in left shoulder: Secondary | ICD-10-CM

## 2015-07-31 NOTE — Patient Instructions (Signed)
We will work on finding options for you for MRI which you can afford. Our office will contact you with the cost and possible payment plans.  If you decide not to get the MRI, that is ok, but we would strongly recommend physical therapy to help with arm pain. You can also make an appointment with Dr. Georgina Snell or Dr. Dianah Field, our sports medicine specialists, for shoulder joint injection. Please call the number on your bill with any billing questions.  Please let us know if there is anything else we can do for you!

## 2015-07-31 NOTE — Progress Notes (Signed)
HPI: Sarah Jennings is a 50 y.o. female who presents to [place] today for chief complaint of:  Chief Complaint  Patient presents with  . Follow-up    LEFT SHOULDER    SHOULDER  . Location: L shoulder joint and L thoracic/scapular pain . Quality: soreness, denies numbness/tingling in arm/fingers  . Duration: several months . Timing: worse with any type of movement, worse with sleeping . Modifying factors: UC treated with meloxicam and tennis elbow strap which she didn't use much, no imaging. At that time, pt declined steroids, muscle relaxants, tramadol. Home care exercises also provided, pt says she did the "hand squeezing" exercises but that's all. Unable to go to PT due to cost. Naproxen is helping. No injury.   Associated signs/symptoms: Concerned about gout.   Of note, patient is on a high deductible insurance, she is very concerned about billing questions from her previous visits with me. She is concerned about cost of MRI and physical therapy.   Past medical, social and family history reviewed: Past Medical History  Diagnosis Date  . Anemia     occas takes iron pills  . Polyp cervix    Past Surgical History  Procedure Laterality Date  . Back surgery  2011    local to remove a cyst  . Svd      x 3  . Tubal ligation    . Breast surgery  1991    cyst removed from left breast - local   Social History  Substance Use Topics  . Smoking status: Never Smoker   . Smokeless tobacco: Never Used  . Alcohol Use: 0.0 oz/week    0 Standard drinks or equivalent per week     Comment: socially   No family history on file.  Current Outpatient Prescriptions  Medication Sig Dispense Refill  . AMBULATORY NON FORMULARY MEDICATION Medication Name: Zostavax IM x 1 vial 1 Units 0  . ibuprofen (ADVIL,MOTRIN) 200 MG tablet Take 200 mg by mouth daily as needed. Headache or PMS    . meloxicam (MOBIC) 7.5 MG tablet Take 1 tablet (7.5 mg total) by mouth  daily. 30 tablet 0  . Multiple Vitamin (MULITIVITAMIN WITH MINERALS) TABS Take 1 tablet by mouth daily.    . naproxen (NAPROSYN) 500 MG tablet Take 500 mg by mouth 2 (two) times daily with a meal.    . OVER THE COUNTER MEDICATION Take 1 tablet by mouth daily. "Hair and Nail vitamin"    . Vitamin D, Ergocalciferol, (DRISDOL) 50000 UNITS CAPS capsule Take 1 capsule (50,000 Units total) by mouth every 7 (seven) days. Take for 8 total doses(weeks) 8 capsule 0   No current facility-administered medications for this visit.   No Known Allergies    Review of Systems: CONSTITUTIONAL:  No  fever, no chills,  CARDIAC: No  chest pain RESPIRATORY: No  cough, No  shortness of breath/wheeze MUSCULOSKELETAL: (+) myalgia/arthralgia as per HPI NEUROLOGIC: No  weakness, No  dizziness, No  slurred speech   Exam:  BP 131/82 mmHg  Pulse 61  Ht 5\' 2"  (1.575 m)  Wt 99 lb (44.906 kg)  BMI 18.10 kg/m2  LMP 06/28/2011 Constitutional: VS see above. General Appearance: alert, well-developed, well-nourished, NAD Musculoskeletal: Gait normal. No clubbing/cyanosis of digits. Shoulder: guarding throughout, limits exam  Empty Can (Supraspinatus): negative  Drop Arm (Supraspinatus): negative  Liftoff (Subscapularis): positive  Speeds (Biceps Tendonitis/Labrum): positive  Hawkins (Impingement/Subacromial Bursitis): positive  Apprehension (Anterior Instability): positive Skin: warm, dry, intact.  No rash/ulcer. No concerning nevi or subq nodules on limited exam.   Psychiatric: Normal judgment/insight. Normal mood and affect. Oriented x3.   Dg Shoulder Left  07/08/2015  CLINICAL DATA:  Left shoulder pain for 3-4 months without trauma. EXAM: LEFT SHOULDER - 2+ VIEW COMPARISON:  None. FINDINGS: Visualized portion of the left hemithorax is normal. No acute fracture or dislocation. Joint spaces maintained. No focal osseous lesion. IMPRESSION: No acute osseous abnormality. Electronically Signed   By: Abigail Miyamoto M.D.    On: 07/08/2015 13:33    ASSESSMENT/PLAN: Advised patient that this is not a gout problem. Vague pain complaints and almost all shoulder movements are painful, difficult to elicit etiology though certainly rotator cuff tendonitis, adhesive capsulitis or labral problem are in the differential. X-ray was negative. She declines steroids or joint injection at this time. Ordered MRI, advised patient that she will get a call about this to discuss cost/payment plan options, but we need further evaluation with other imaging to best treat her, or she needs to go to physical therapy or schedule a visit with one of our sports medicine physicians for joint injection. Regarding any billing questions, I highlighted the number to call on the patient's bill, advised patient that I document appropriately anything that we addressed at our visit, I'm not able to be familiar with the intricacies of her insurance's billing policies, she needs to take this up with her insurance or with [facility] billing department.   Rotator cuff tendinitis, left - Plan: MR Shoulder Left Wo Contrast  Left shoulder pain - Plan: MR Shoulder Left Wo Contrast    Return if symptoms worsen or fail to improve.

## 2017-04-29 IMAGING — DX DG SHOULDER 2+V*L*
3 series · 3 of 3 positions shown · non-contrast
Comparison: None.

CLINICAL DATA: Left shoulder pain for 3-4 months without trauma.

EXAM:
LEFT SHOULDER - 2+ VIEW

[shoulder grashey]
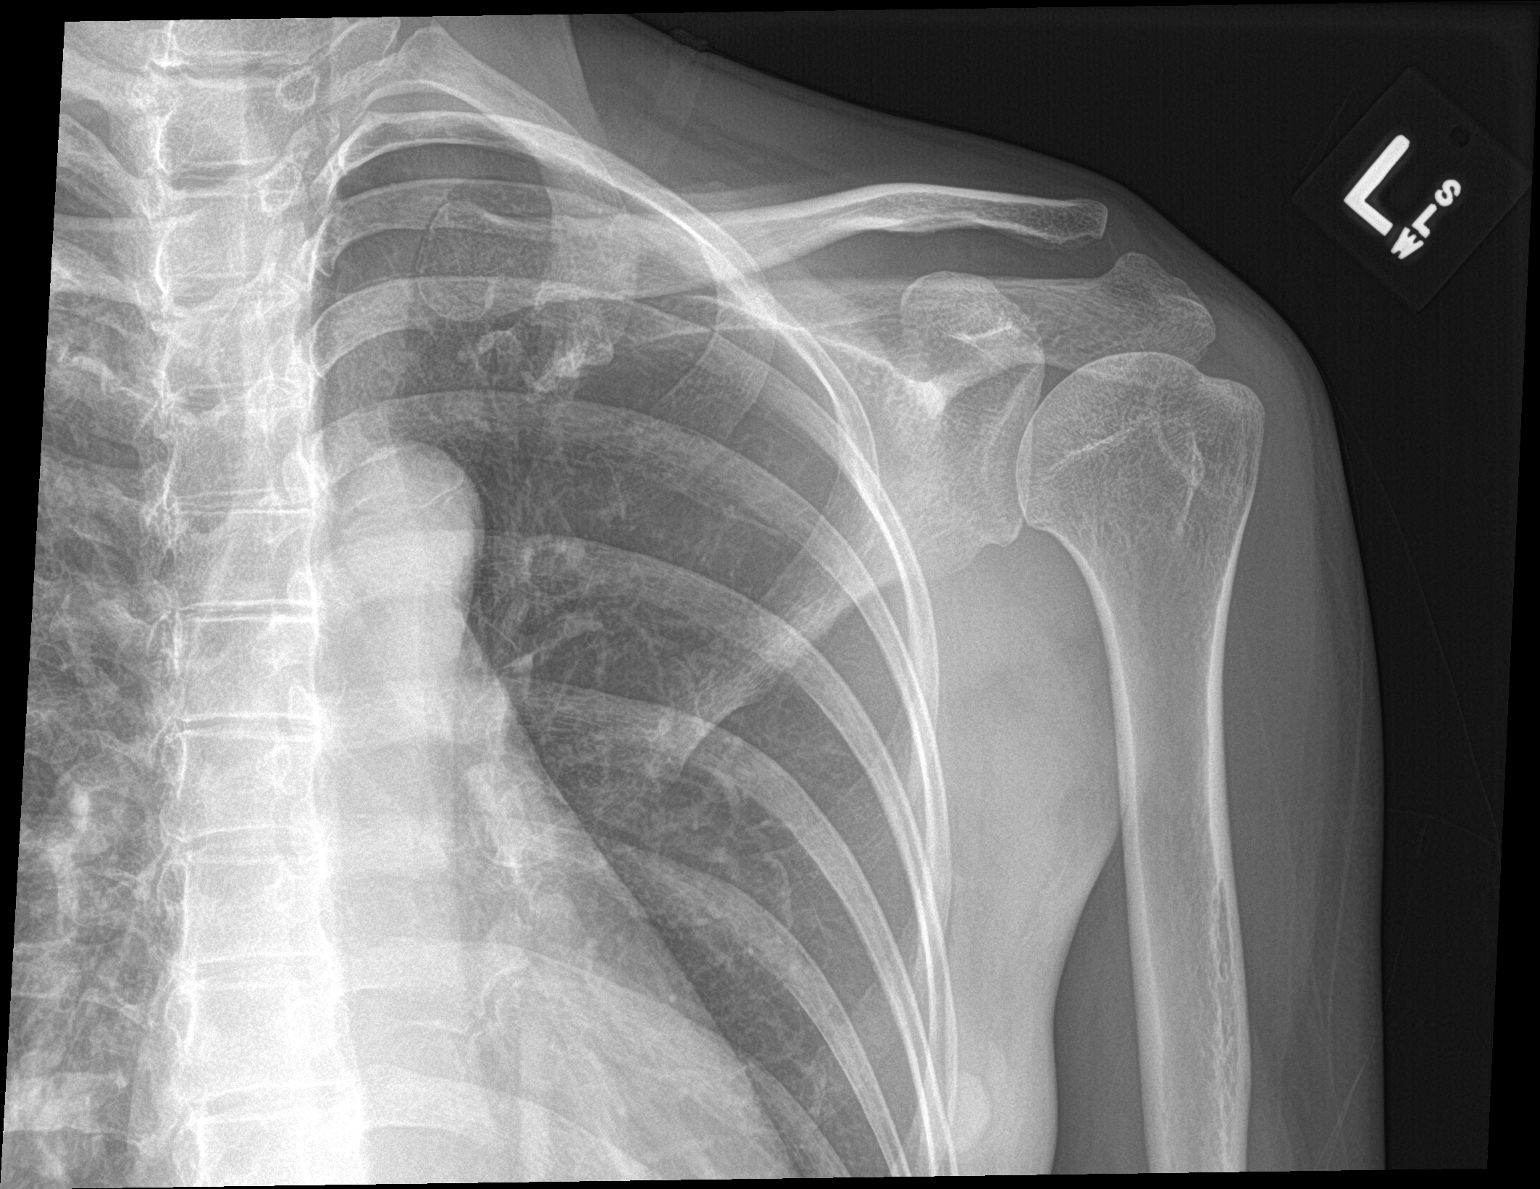

[shoulder y view]
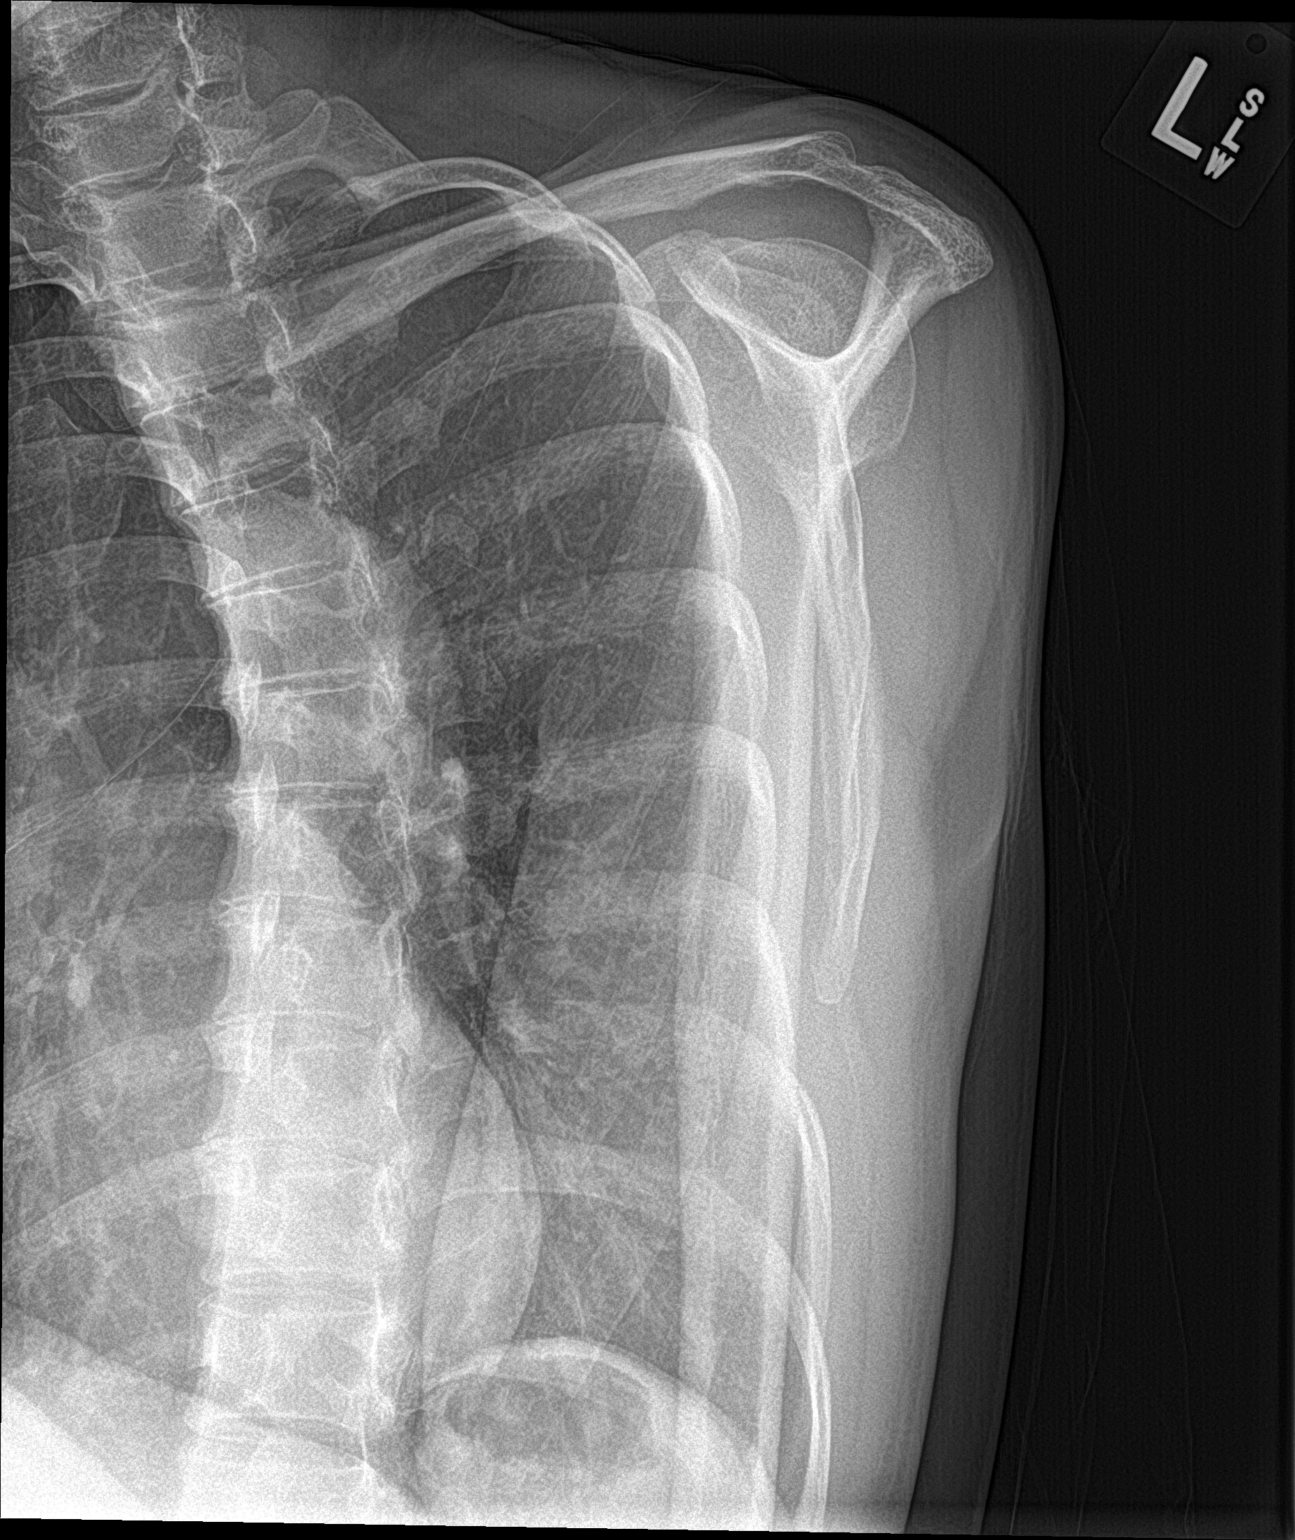

[shoulder axillary]
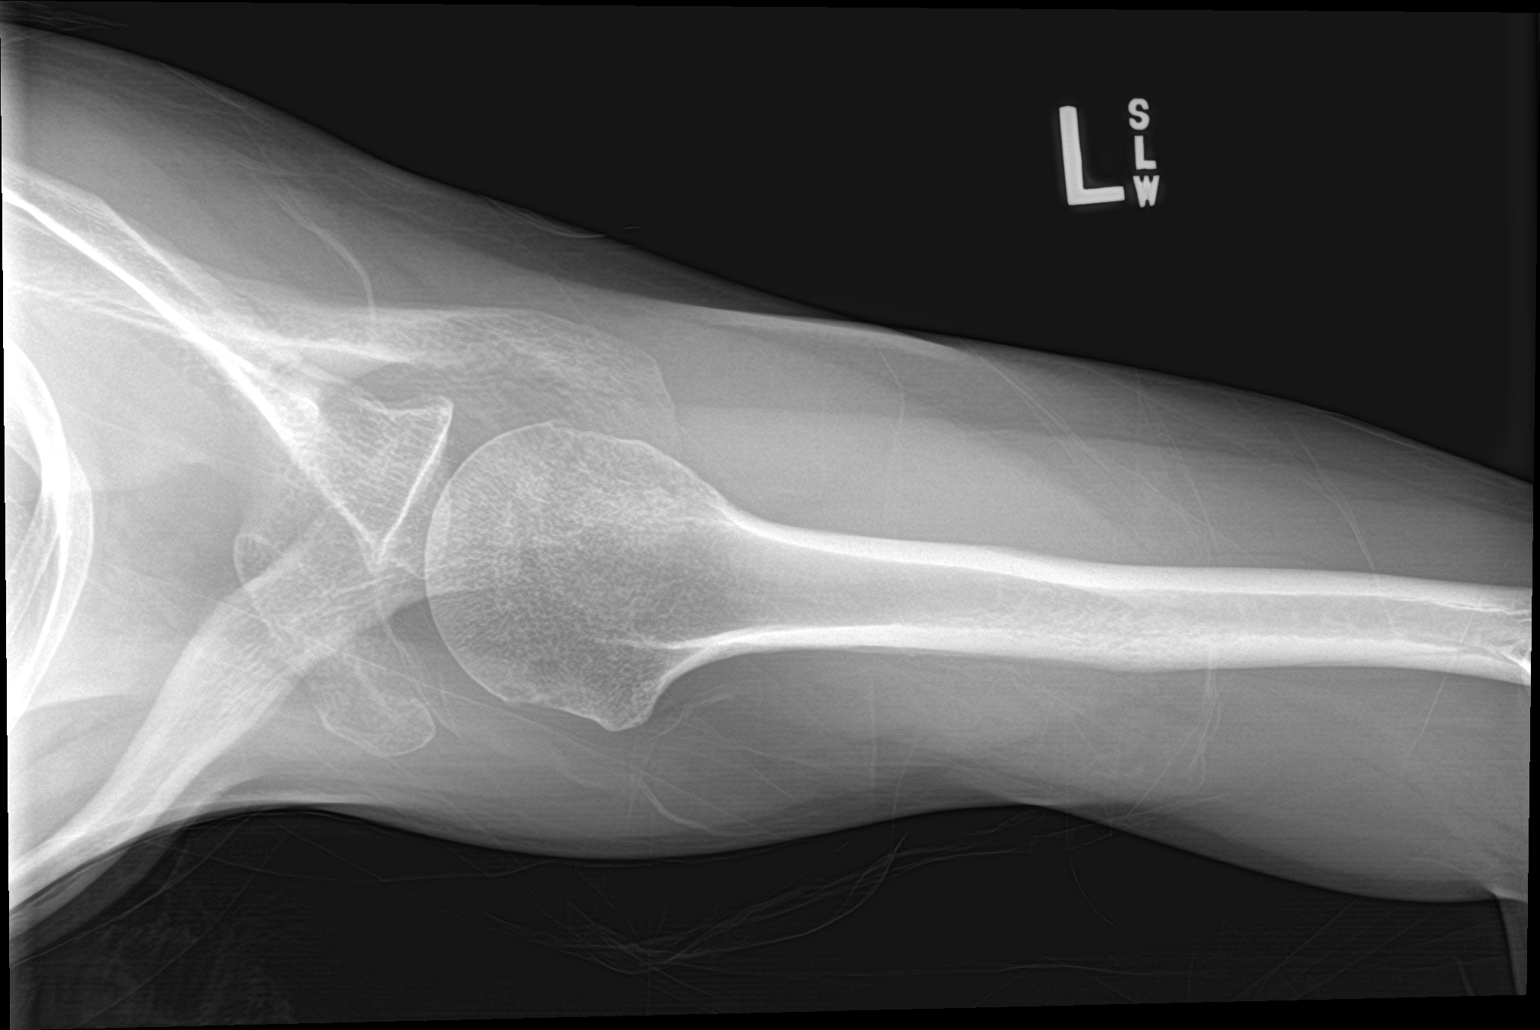

[3 of 3 positions shown; findings below may reference images not displayed]

FINDINGS: Visualized portion of the left hemithorax is normal. No acute
fracture or dislocation. Joint spaces maintained. No focal osseous
lesion.
IMPRESSION: No acute osseous abnormality.

## 2018-01-26 ENCOUNTER — Encounter: Payer: Self-pay | Admitting: Internal Medicine

## 2018-02-03 ENCOUNTER — Other Ambulatory Visit: Payer: Self-pay | Admitting: Obstetrics and Gynecology

## 2018-02-03 DIAGNOSIS — R928 Other abnormal and inconclusive findings on diagnostic imaging of breast: Secondary | ICD-10-CM

## 2018-03-15 ENCOUNTER — Encounter: Payer: 59 | Admitting: Internal Medicine

## 2018-04-21 ENCOUNTER — Other Ambulatory Visit: Payer: 59

## 2018-05-27 ENCOUNTER — Encounter: Payer: Self-pay | Admitting: Gastroenterology

## 2018-07-01 ENCOUNTER — Encounter: Payer: Self-pay | Admitting: Gastroenterology

## 2019-05-17 ENCOUNTER — Encounter: Payer: Self-pay | Admitting: Gastroenterology

## 2019-06-05 ENCOUNTER — Encounter: Payer: Self-pay | Admitting: Medical-Surgical

## 2019-06-12 ENCOUNTER — Other Ambulatory Visit: Payer: Self-pay

## 2019-06-12 ENCOUNTER — Ambulatory Visit (AMBULATORY_SURGERY_CENTER): Payer: Self-pay | Admitting: *Deleted

## 2019-06-12 VITALS — Temp 96.8°F | Ht 61.0 in | Wt 106.0 lb

## 2019-06-12 DIAGNOSIS — Z1211 Encounter for screening for malignant neoplasm of colon: Secondary | ICD-10-CM

## 2019-06-12 MED ORDER — NA SULFATE-K SULFATE-MG SULF 17.5-3.13-1.6 GM/177ML PO SOLN
ORAL | 0 refills | Status: DC
Start: 2019-06-12 — End: 2019-06-26

## 2019-06-12 NOTE — Progress Notes (Signed)
Patient is here in-person for PV. Patient denies any allergies to eggs or soy. Patient denies any problems with anesthesia/sedation. Patient denies any oxygen use at home. Patient denies taking any diet/weight loss medications or blood thinners. Patient is not being treated for MRSA or C-diff. EMMI education assisgned to the patient for the procedure, this was explained and instructions given to patient. COVID-19 screening test is not ordered, both vaccines completed per pt. In 04/2019. Patient is aware of our care-partner policy and 0000000 safety protocol.   Prep Prescription coupon given to the patient.

## 2019-06-22 ENCOUNTER — Encounter: Payer: Self-pay | Admitting: Gastroenterology

## 2019-06-26 ENCOUNTER — Ambulatory Visit (AMBULATORY_SURGERY_CENTER): Payer: PRIVATE HEALTH INSURANCE | Admitting: Gastroenterology

## 2019-06-26 ENCOUNTER — Encounter: Payer: Self-pay | Admitting: Gastroenterology

## 2019-06-26 ENCOUNTER — Other Ambulatory Visit: Payer: Self-pay

## 2019-06-26 VITALS — BP 97/63 | HR 64 | Temp 96.6°F | Resp 16 | Ht 61.0 in | Wt 106.0 lb

## 2019-06-26 DIAGNOSIS — Z1211 Encounter for screening for malignant neoplasm of colon: Secondary | ICD-10-CM | POA: Diagnosis not present

## 2019-06-26 DIAGNOSIS — K621 Rectal polyp: Secondary | ICD-10-CM

## 2019-06-26 DIAGNOSIS — D128 Benign neoplasm of rectum: Secondary | ICD-10-CM

## 2019-06-26 MED ORDER — SODIUM CHLORIDE 0.9 % IV SOLN: 500.0000 mL | Freq: Once | INTRAVENOUS | Status: DC

## 2019-06-26 NOTE — Op Note (Signed)
[facility] Patient Name: Sarah Jennings Procedure Date: 06/26/2019 10:20 AM MRN: [id number] Endoscopist: Ladene Artist , MD Age: 54 Referring MD:  Date of Birth: 1965-08-18 Gender: Female Account #: [id number] Procedure:                Colonoscopy Indications:              Screening for colorectal malignant neoplasm Medicines:                Monitored Anesthesia Care Procedure:                Pre-Anesthesia Assessment:                           - Prior to the procedure, a History and Physical                            was performed, and patient medications and                            allergies were reviewed. The patient's tolerance of                            previous anesthesia was also reviewed. The risks                            and benefits of the procedure and the sedation                            options and risks were discussed with the patient.                            All questions were answered, and informed consent                            was obtained. Prior Anticoagulants: The patient has                            taken no previous anticoagulant or antiplatelet                            agents. ASA Grade Assessment: II - A patient with                            mild systemic disease. After reviewing the risks                            and benefits, the patient was deemed in                            satisfactory condition to undergo the procedure.                           After obtaining informed consent, the colonoscope  was passed under direct vision. Throughout the                            procedure, the patient's blood pressure, pulse, and                            oxygen saturations were monitored continuously. The                            Colonoscope was introduced through the anus and                            advanced to the the cecum, identified by                            appendiceal orifice and  ileocecal valve. The                            ileocecal valve, appendiceal orifice, and rectum                            were photographed. The quality of the bowel                            preparation was excellent. The colonoscopy was                            performed without difficulty. The patient tolerated                            the procedure well. Scope In: 10:28:51 AM Scope Out: 10:45:11 AM Scope Withdrawal Time: 0 hours 13 minutes 2 seconds  Total Procedure Duration: 0 hours 16 minutes 20 seconds  Findings:                 The perianal and digital rectal examinations were                            normal.                           A 6 mm polyp was found in the rectum. The polyp was                            sessile. The polyp was removed with a cold snare.                            Resection and retrieval were complete.                           Internal hemorrhoids were found during                            retroflexion. The hemorrhoids were small and Grade  I (internal hemorrhoids that do not prolapse).                           The exam was otherwise without abnormality on                            direct and retroflexion views. Complications:            No immediate complications. Estimated blood loss:                            None. Estimated Blood Loss:     Estimated blood loss: none. Impression:               - One 6 mm polyp in the rectum, removed with a cold                            snare. Resected and retrieved.                           - Internal hemorrhoids.                           - The examination was otherwise normal on direct                            and retroflexion views. Recommendation:           - Repeat colonoscopy after studies are complete for                            surveillance based on pathology results.                           - Patient has a contact number available for                             emergencies. The signs and symptoms of potential                            delayed complications were discussed with the                            patient. Return to normal activities tomorrow.                            Written discharge instructions were provided to the                            patient.                           - Resume previous diet.                           - Continue present medications.                           -  Await pathology results. Ladene Artist, MD 06/26/2019 10:52:47 AM This report has been signed electronically.

## 2019-06-26 NOTE — Progress Notes (Signed)
A and O x3. Report to RN. Tolerated MAC anesthesia well.

## 2019-06-26 NOTE — Progress Notes (Signed)
Called to room to assist during endoscopic procedure.  Patient ID and intended procedure confirmed with present staff. Received instructions for my participation in the procedure from the performing physician.  

## 2019-06-26 NOTE — Patient Instructions (Signed)
Handouts given for polyps and hemorrhoids.  Await pathology results.  YOU HAD AN ENDOSCOPIC PROCEDURE TODAY AT [facility]:   Refer to the procedure report that was given to you for any specific questions about what was found during the examination.  If the procedure report does not answer your questions, please call your gastroenterologist to clarify.  If you requested that your care partner not be given the details of your procedure findings, then the procedure report has been included in a sealed envelope for you to review at your convenience later.  YOU SHOULD EXPECT: Some feelings of bloating in the abdomen. Passage of more gas than usual.  Walking can help get rid of the air that was put into your GI tract during the procedure and reduce the bloating. If you had a lower endoscopy (such as a colonoscopy or flexible sigmoidoscopy) you may notice spotting of blood in your stool or on the toilet paper. If you underwent a bowel prep for your procedure, you may not have a normal bowel movement for a few days.  Please Note:  You might notice some irritation and congestion in your nose or some drainage.  This is from the oxygen used during your procedure.  There is no need for concern and it should clear up in a day or so.  SYMPTOMS TO REPORT IMMEDIATELY:   Following lower endoscopy (colonoscopy or flexible sigmoidoscopy):  Excessive amounts of blood in the stool  Significant tenderness or worsening of abdominal pains  Swelling of the abdomen that is new, acute  Fever of 100F or higher  For urgent or emergent issues, a gastroenterologist can be reached at any hour by calling [phone]. Do not use MyChart messaging for urgent concerns.    DIET:  We do recommend a small meal at first, but then you may proceed to your regular diet.  Drink plenty of fluids but you should avoid alcoholic beverages for 24 hours.  ACTIVITY:  You should plan to take it easy for the rest of today  and you should NOT DRIVE or use heavy machinery until tomorrow (because of the sedation medicines used during the test).    FOLLOW UP: Our staff will call the number listed on your records 48-72 hours following your procedure to check on you and address any questions or concerns that you may have regarding the information given to you following your procedure. If we do not reach you, we will leave a message.  We will attempt to reach you two times.  During this call, we will ask if you have developed any symptoms of COVID 19. If you develop any symptoms (ie: fever, flu-like symptoms, shortness of breath, cough etc.) before then, please call [phone].  If you test positive for Covid 19 in the 2 weeks post procedure, please call and report this information to [place].    If any biopsies were taken you will be contacted by phone or by letter within the next 1-3 weeks.  Please call us at [phone] if you have not heard about the biopsies in 3 weeks.    SIGNATURES/CONFIDENTIALITY: You and/or your care partner have signed paperwork which will be entered into your electronic medical record.  These signatures attest to the fact that that the information above on your After Visit Summary has been reviewed and is understood.  Full responsibility of the confidentiality of this discharge information lies with you and/or your care-partner.

## 2019-06-26 NOTE — Progress Notes (Signed)
Pt's states no medical or surgical changes since previsit or office visit. VS by KA. Temp by ADB

## 2019-06-28 ENCOUNTER — Telehealth: Payer: Self-pay | Admitting: *Deleted

## 2019-06-28 NOTE — Telephone Encounter (Signed)
No answer for post procedure call back. Left message for patient and will attempt to call back later today.

## 2019-06-28 NOTE — Telephone Encounter (Signed)
Returning call. She is doing well after her procedurs. Is not having any problems or issues at this time. Advised her to call our office if any problems arise in the future.She has our office phone number available.

## 2019-06-28 NOTE — Telephone Encounter (Signed)
  Follow up Call-  Call back number 06/26/2019  Post procedure Call Back phone  # [phone]  Permission to leave phone message Yes  Some recent data might be hidden     Patient questions:  Do you have a fever, pain , or abdominal swelling? No. Pain Score  0 *  Have you tolerated food without any problems? yes  Have you been able to return to your normal activities? Yes.    Do you have any questions about your discharge instructions: Diet   No. Medications  No. Follow up visit  No.  Do you have questions or concerns about your Care? No.  Actions: * If pain score is 4 or above: No action needed, pain <4.  1. Have you developed a fever since your procedure? no  2.   Have you had an respiratory symptoms (SOB or cough) since your procedure? no  3.   Have you tested positive for COVID 19 since your procedure no  4.   Have you had any family members/close contacts diagnosed with the COVID 19 since your procedure?  no   If yes to any of these questions please route to Joylene John, RN and Erenest Rasher, RN

## 2019-07-04 ENCOUNTER — Encounter: Payer: Self-pay | Admitting: Gastroenterology
